# Patient Record
Sex: Male | Born: 2000 | Hispanic: Yes | Marital: Single | Smoking: Never smoker
Health system: Southern US, Community
[De-identification: ages and names within clinical notes are randomized; demographics above are authoritative.]

## PROBLEM LIST (undated history)

## (undated) DIAGNOSIS — K219 Gastro-esophageal reflux disease without esophagitis: Secondary | ICD-10-CM

## (undated) DIAGNOSIS — F410 Panic disorder [episodic paroxysmal anxiety] without agoraphobia: Secondary | ICD-10-CM

## (undated) DIAGNOSIS — J45909 Unspecified asthma, uncomplicated: Secondary | ICD-10-CM

## (undated) HISTORY — PX: HAND TENDON SURGERY: SHX663

---
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
## 2020-01-21 ENCOUNTER — Other Ambulatory Visit: Payer: Self-pay

## 2020-01-21 ENCOUNTER — Ambulatory Visit (HOSPITAL_COMMUNITY)
Admission: EM | Admit: 2020-01-21 | Discharge: 2020-01-21 | Disposition: A | Discharge status: Home / Self Care | Payer: No Typology Code available for payment source | Attending: Emergency Medicine | Admitting: Emergency Medicine

## 2020-01-21 ENCOUNTER — Encounter (HOSPITAL_COMMUNITY): Payer: Self-pay | Admitting: *Deleted

## 2020-01-21 DIAGNOSIS — Z79899 Other long term (current) drug therapy: Secondary | ICD-10-CM | POA: Diagnosis not present

## 2020-01-21 DIAGNOSIS — J029 Acute pharyngitis, unspecified: Secondary | ICD-10-CM | POA: Diagnosis present

## 2020-01-21 DIAGNOSIS — R05 Cough: Secondary | ICD-10-CM | POA: Diagnosis not present

## 2020-01-21 DIAGNOSIS — J45909 Unspecified asthma, uncomplicated: Secondary | ICD-10-CM | POA: Insufficient documentation

## 2020-01-21 DIAGNOSIS — J069 Acute upper respiratory infection, unspecified: Secondary | ICD-10-CM | POA: Diagnosis not present

## 2020-01-21 DIAGNOSIS — Z20822 Contact with and (suspected) exposure to covid-19: Secondary | ICD-10-CM | POA: Insufficient documentation

## 2020-01-21 HISTORY — DX: Unspecified asthma, uncomplicated: J45.909

## 2020-01-21 LAB — SARS CORONAVIRUS 2 (TAT 6-24 HRS): SARS Coronavirus 2: NEGATIVE

## 2020-01-21 MED ORDER — FLUTICASONE PROPIONATE 50 MCG/ACT NA SUSP: 1.0000 | Freq: Every day | NASAL | 0 refills | Status: AC

## 2020-01-21 MED ORDER — CETIRIZINE-PSEUDOEPHEDRINE ER 5-120 MG PO TB12: 1.0000 | ORAL_TABLET | Freq: Two times a day (BID) | ORAL | 0 refills | Status: AC

## 2020-01-21 NOTE — ED Triage Notes (Signed)
Pt reports an insect flying into his nose approx 4 days ago; states since then has had significant nasal congestion, runny nose and sore throat.  Denies fevers.

## 2020-01-21 NOTE — Discharge Instructions (Signed)
Covid test pending, monitor my chart for results Begin daily Zyrtec-D twice daily for congestion Flonase nasal spray 1 to 2 spray in each nostril daily Rest and fluids  Please return if any symptoms not improving or worsening

## 2020-01-22 NOTE — ED Provider Notes (Signed)
[facility]    CSN: [id number] Arrival date & time: 01/21/20  1657      History   Chief Complaint Chief Complaint  Patient presents with  . Nasal Congestion  . Sore Throat    HPI Bruce Maddox is a 19 y.o. male history of asthma, presenting today for evaluation of URI symptoms.  Patient has had nasal congestion, rhinorrhea and sore throat.  Symptoms have been going on for approximately 4 days.  Reports occasional cough, denies fevers.  Denies chest pain or shortness of breath.  Denies close sick contacts.  HPI  Past Medical History:  Diagnosis Date  . Asthma   . GERD (gastroesophageal reflux disease)   . Panic attack     There are no problems to display for this patient.   Past Surgical History:  Procedure Laterality Date  . HAND TENDON SURGERY         Home Medications    Prior to Admission medications   Medication Sig Start Date End Date Taking? Authorizing Provider  cetirizine-pseudoephedrine (ZYRTEC-D) 5-120 MG tablet Take 1 tablet by mouth 2 (two) times daily. 01/21/20   Williard Keller C, PA-C  fluticasone (FLONASE) 50 MCG/ACT nasal spray Place 1-2 sprays into both nostrils daily for 7 days. 01/21/20 01/28/20  Sunya Humbarger C, PA-C  naproxen (NAPROSYN) 250 MG tablet Take 1 tablet (250 mg total) by mouth 2 (two) times daily with a meal. 05/01/18   Baity, Salvadore Oxford, NP    Family History Family History  Problem Relation Age of Onset  . Healthy Mother   . Healthy Father     Social History Social History   Tobacco Use  . Smoking status: Never Smoker  . Smokeless tobacco: Never Used  Vaping Use  . Vaping Use: Never used  Substance Use Topics  . Alcohol use: Yes    Comment: occasionally  . Drug use: Not Currently    Types: Marijuana     Allergies   Patient has no known allergies.   Review of Systems Review of Systems  Constitutional: Negative for activity change, appetite change, chills, fatigue and fever.  HENT: Positive  for congestion, rhinorrhea and sore throat. Negative for ear pain, sinus pressure and trouble swallowing.   Eyes: Negative for discharge and redness.  Respiratory: Negative for cough, chest tightness and shortness of breath.   Cardiovascular: Negative for chest pain.  Gastrointestinal: Negative for abdominal pain, diarrhea, nausea and vomiting.  Musculoskeletal: Negative for myalgias.  Skin: Negative for rash.  Neurological: Negative for dizziness, light-headedness and headaches.     Physical Exam Triage Vital Signs ED Triage Vitals [01/21/20 1757]  Enc Vitals Group     BP 129/83     Pulse Rate 91     Resp 16     Temp 98.5 F (36.9 C)     Temp Source Oral     SpO2 98 %     Weight      Height      Head Circumference      Peak Flow      Pain Score 0     Pain Loc      Pain Edu?      Excl. in GC?    No data found.  Updated Vital Signs BP 129/83   Pulse 91   Temp 98.5 F (36.9 C) (Oral)   Resp 16   SpO2 98%   Visual Acuity Right Eye Distance:   Left Eye Distance:   Bilateral Distance:  Right Eye Near:   Left Eye Near:    Bilateral Near:     Physical Exam Vitals and nursing note reviewed.  Constitutional:      Appearance: He is well-developed.     Comments: No acute distress  HENT:     Head: Normocephalic and atraumatic.     Ears:     Comments: Bilateral ears without tenderness to palpation of external auricle, tragus and mastoid, EAC's without erythema or swelling, TM's with good bony landmarks and cone of light. Non erythematous.     Nose: Nose normal.     Mouth/Throat:     Comments: Oral mucosa pink and moist, no tonsillar enlargement or exudate. Posterior pharynx patent and nonerythematous, no uvula deviation or swelling. Normal phonation. Eyes:     Conjunctiva/sclera: Conjunctivae normal.  Cardiovascular:     Rate and Rhythm: Normal rate.  Pulmonary:     Effort: Pulmonary effort is normal. No respiratory distress.     Comments: Breathing  comfortably at rest, CTABL, no wheezing, rales or other adventitious sounds auscultated Abdominal:     General: There is no distension.  Musculoskeletal:        General: Normal range of motion.     Cervical back: Neck supple.  Skin:    General: Skin is warm and dry.  Neurological:     Mental Status: He is alert and oriented to person, place, and time.      UC Treatments / Results  Labs (all labs ordered are listed, but only abnormal results are displayed) Labs Reviewed  SARS CORONAVIRUS 2 (TAT 6-24 HRS)    EKG   Radiology No results found.  Procedures Procedures (including critical care time)  Medications Ordered in UC Medications - No data to display  Initial Impression / Assessment and Plan / UC Course  I have reviewed the triage vital signs and the nursing notes.  Pertinent labs & imaging results that were available during my care of the patient were reviewed by me and considered in my medical decision making (see chart for details).     URI symptoms x4 days, exam unremarkable, vital signs stable.  Covid PCR pending.  Recommending symptomatic and supportive care with close monitoring.  Discussed strict return precautions. Patient verbalized understanding and is agreeable with plan.  Final Clinical Impressions(s) / UC Diagnoses   Final diagnoses:  Viral URI with cough     Discharge Instructions     Covid test pending, monitor my chart for results Begin daily Zyrtec-D twice daily for congestion Flonase nasal spray 1 to 2 spray in each nostril daily Rest and fluids  Please return if any symptoms not improving or worsening   ED Prescriptions    Medication Sig Dispense Auth. Provider   cetirizine-pseudoephedrine (ZYRTEC-D) 5-120 MG tablet Take 1 tablet by mouth 2 (two) times daily. 30 tablet Codee Bloodworth C, PA-C   fluticasone (FLONASE) 50 MCG/ACT nasal spray Place 1-2 sprays into both nostrils daily for 7 days. 1 g Mckenley Birenbaum, Sunflower C, PA-C     PDMP  not reviewed this encounter.   Janith Lima, PA-C 01/22/20 1105

## 2020-03-04 ENCOUNTER — Emergency Department
Admission: EM | Admit: 2020-03-04 | Discharge: 2020-03-05 | Payer: Medicaid Other | Attending: Emergency Medicine | Admitting: Emergency Medicine

## 2020-03-04 ENCOUNTER — Other Ambulatory Visit: Payer: Self-pay

## 2020-03-04 DIAGNOSIS — Z7951 Long term (current) use of inhaled steroids: Secondary | ICD-10-CM | POA: Diagnosis not present

## 2020-03-04 DIAGNOSIS — Y9289 Other specified places as the place of occurrence of the external cause: Secondary | ICD-10-CM | POA: Diagnosis not present

## 2020-03-04 DIAGNOSIS — Y999 Unspecified external cause status: Secondary | ICD-10-CM | POA: Insufficient documentation

## 2020-03-04 DIAGNOSIS — S61012A Laceration without foreign body of left thumb without damage to nail, initial encounter: Secondary | ICD-10-CM | POA: Insufficient documentation

## 2020-03-04 DIAGNOSIS — J45909 Unspecified asthma, uncomplicated: Secondary | ICD-10-CM | POA: Insufficient documentation

## 2020-03-04 DIAGNOSIS — S51011A Laceration without foreign body of right elbow, initial encounter: Secondary | ICD-10-CM | POA: Insufficient documentation

## 2020-03-04 DIAGNOSIS — S61412A Laceration without foreign body of left hand, initial encounter: Secondary | ICD-10-CM | POA: Diagnosis not present

## 2020-03-04 DIAGNOSIS — S61411A Laceration without foreign body of right hand, initial encounter: Secondary | ICD-10-CM | POA: Diagnosis not present

## 2020-03-04 DIAGNOSIS — W25XXXA Contact with sharp glass, initial encounter: Secondary | ICD-10-CM | POA: Insufficient documentation

## 2020-03-04 DIAGNOSIS — S61511A Laceration without foreign body of right wrist, initial encounter: Secondary | ICD-10-CM | POA: Diagnosis not present

## 2020-03-04 DIAGNOSIS — Y9339 Activity, other involving climbing, rappelling and jumping off: Secondary | ICD-10-CM | POA: Diagnosis not present

## 2020-03-04 DIAGNOSIS — S61419A Laceration without foreign body of unspecified hand, initial encounter: Secondary | ICD-10-CM

## 2020-03-04 DIAGNOSIS — S41112A Laceration without foreign body of left upper arm, initial encounter: Secondary | ICD-10-CM | POA: Diagnosis present

## 2020-03-04 MED ORDER — LIDOCAINE HCL (PF) 1 % IJ SOLN
INTRAMUSCULAR | Status: AC
  Filled 2020-03-04: qty 15

## 2020-03-04 MED ORDER — LIDOCAINE HCL (PF) 1 % IJ SOLN
INTRAMUSCULAR | Status: AC
  Filled 2020-03-04: qty 5

## 2020-03-04 MED ORDER — LIDOCAINE HCL (PF) 1 % IJ SOLN
INTRAMUSCULAR | Status: AC
  Administered 2020-03-05: 30 mL via INTRADERMAL
  Filled 2020-03-04: qty 10

## 2020-03-04 NOTE — ED Notes (Signed)
EDP and PA bedside.
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
## 2020-03-05 MED ORDER — LIDOCAINE HCL (PF) 1 % IJ SOLN: 30.0000 mL | Freq: Once | INTRAMUSCULAR | Status: AC

## 2020-03-05 MED ORDER — SODIUM CHLORIDE 0.9 % IV BOLUS
1000.0000 mL | Freq: Once | INTRAVENOUS | Status: AC
  Administered 2020-03-05: 1000 mL via INTRAVENOUS

## 2020-03-05 MED ORDER — CEPHALEXIN 500 MG PO CAPS: 500.0000 mg | ORAL_CAPSULE | Freq: Two times a day (BID) | ORAL | 0 refills | Status: AC

## 2020-03-05 NOTE — ED Provider Notes (Signed)
[facility] Emergency Department Provider Note  ____________________________________________   First MD Initiated Contact with Patient 03/05/20 0014     (approximate)  I have reviewed the triage vital signs and the nursing notes.   HISTORY  Chief Complaint Laceration    HPI Bruce Maddox is a 19 y.o. male with below list of previous medical conditions presents to the emergency department in police custody secondary to multiple lacerations on the bilateral upper extremity.  Per the police officer the patient threw a brick through a window and attempted to climb through the window where the lacerations were sustained.       Past Medical History:  Diagnosis Date  . Asthma   . GERD (gastroesophageal reflux disease)   . Panic attack     There are no problems to display for this patient.   Past Surgical History:  Procedure Laterality Date  . HAND TENDON SURGERY      Prior to Admission medications   Medication Sig Start Date End Date Taking? Authorizing Provider  cetirizine-pseudoephedrine (ZYRTEC-D) 5-120 MG tablet Take 1 tablet by mouth 2 (two) times daily. 01/21/20   Wieters, Hallie C, PA-C  fluticasone (FLONASE) 50 MCG/ACT nasal spray Place 1-2 sprays into both nostrils daily for 7 days. 01/21/20 01/28/20  Wieters, Hallie C, PA-C  naproxen (NAPROSYN) 250 MG tablet Take 1 tablet (250 mg total) by mouth 2 (two) times daily with a meal. 05/01/18   Baity, Salvadore Oxford, NP    Allergies Patient has no known allergies.  Family History  Problem Relation Age of Onset  . Healthy Mother   . Healthy Father     Social History Social History   Tobacco Use  . Smoking status: Never Smoker  . Smokeless tobacco: Never Used  Vaping Use  . Vaping Use: Never used  Substance Use Topics  . Alcohol use: Yes    Comment: occasionally  . Drug use: Not Currently    Types: Marijuana    Review of Systems Constitutional: No fever/chills Eyes: No visual  changes. ENT: No sore throat. Cardiovascular: Denies chest pain. Respiratory: Denies shortness of breath. Gastrointestinal: No abdominal pain.  No nausea, no vomiting.  No diarrhea.  No constipation. Genitourinary: Negative for dysuria. Musculoskeletal: Negative for neck pain.  Negative for back pain. Integumentary: Negative for rash. Neurological: Negative for headaches, focal weakness or numbness. Psychiatric:  Patient denies any suicidal or homicidal ideation ____________________________________________   PHYSICAL EXAM:  VITAL SIGNS: ED Triage Vitals  Enc Vitals Group     BP --      Pulse --      Resp --      Temp --      Temp src --      SpO2 03/04/20 2325 96 %     Weight 03/04/20 2336 81.6 kg (180 lb)     Height 03/04/20 2336 1.778 m (5\' 10" )     Head Circumference --      Peak Flow --      Pain Score 03/04/20 2336 0     Pain Loc --      Pain Edu? --      Excl. in GC? --     Constitutional: Alert and oriented.  Eyes: Conjunctivae are normal.  Head: Atraumatic. Mouth/Throat: Patient is wearing a mask. Neck: No stridor.  No meningeal signs.   Cardiovascular: Normal rate, regular rhythm. Good peripheral circulation. Grossly normal heart sounds. Respiratory: Normal respiratory effort.  No retractions. Gastrointestinal: Soft and  nontender. No distention.  Musculoskeletal: No lower extremity tenderness nor edema. No gross deformities of extremities. Neurologic:  Normal speech and language. No gross focal neurologic deficits are appreciated.  Skin: 11 cm linear laceration right elbow.  4 cm V-shaped linear laceration right wrist on the ulnar aspect.  Please refer to procedure notes below regarding location and length of all lacerations. Psychiatric: Mood and affect are normal. Speech and behavior are normal.   ___________________________   PROCEDURES   Procedure(s) performed (including Critical Care):  Marland KitchenMarland KitchenLaceration Repair  Date/Time: 03/05/2020 2:22  AM Performed by: Darci Current, MD Authorized by: Darci Current, MD   Consent:    Consent obtained:  Verbal   Consent given by:  Patient   Risks discussed:  Infection, pain, retained foreign body, poor cosmetic result and poor wound healing Anesthesia (see MAR for exact dosages):    Anesthesia method:  Local infiltration   Local anesthetic:  Lidocaine 1% w/o epi Laceration details:    Location:  Shoulder/arm   Shoulder/arm location:  R elbow   Length (cm):  11 Repair type:    Repair type:  Simple Exploration:    Hemostasis achieved with:  Direct pressure   Wound exploration: entire depth of wound probed and visualized     Contaminated: no   Treatment:    Area cleansed with:  Saline and Betadine   Amount of cleaning:  Extensive   Irrigation solution:  Sterile saline   Visualized foreign bodies/material removed: no   Skin repair:    Repair method:  Sutures   Suture size:  4-0   Suture material:  Nylon   Suture technique:  Simple interrupted   Number of sutures:  11 Approximation:    Approximation:  Close Post-procedure details:    Dressing:  Sterile dressing   Patient tolerance of procedure:  Tolerated well, no immediate complications .Marland KitchenLaceration Repair  Date/Time: 03/05/2020 2:22 AM Performed by: Darci Current, MD Authorized by: Darci Current, MD   Consent:    Consent obtained:  Verbal   Consent given by:  Patient   Risks discussed:  Infection, pain, retained foreign body, poor cosmetic result and poor wound healing Anesthesia (see MAR for exact dosages):    Anesthesia method:  Local infiltration   Local anesthetic:  Lidocaine 1% w/o epi Laceration details:    Location:  Hand   Hand location:  L palm   Length (cm):  2 Repair type:    Repair type:  Simple Exploration:    Hemostasis achieved with:  Direct pressure   Wound exploration: entire depth of wound probed and visualized     Contaminated: no   Treatment:    Area cleansed with:  Saline    Amount of cleaning:  Extensive   Irrigation solution:  Sterile saline   Visualized foreign bodies/material removed: no   Skin repair:    Repair method:  Sutures   Suture size:  4-0   Suture material:  Nylon   Suture technique:  Simple interrupted   Number of sutures:  3 Approximation:    Approximation:  Close Post-procedure details:    Dressing:  Sterile dressing   Patient tolerance of procedure:  Tolerated well, no immediate complications .Marland KitchenLaceration Repair  Date/Time: 03/05/2020 2:23 AM Performed by: Darci Current, MD Authorized by: Darci Current, MD   Consent:    Consent obtained:  Verbal   Consent given by:  Patient   Risks discussed:  Infection, pain, retained foreign body, poor cosmetic  result and poor wound healing Anesthesia (see MAR for exact dosages):    Anesthesia method:  Local infiltration   Local anesthetic:  Lidocaine 1% w/o epi Laceration details:    Location:  Hand   Hand location:  L palm   Length (cm):  4 Repair type:    Repair type:  Simple Exploration:    Hemostasis achieved with:  Direct pressure   Wound exploration: entire depth of wound probed and visualized     Contaminated: no   Treatment:    Area cleansed with:  Saline   Amount of cleaning:  Extensive   Irrigation solution:  Sterile saline   Visualized foreign bodies/material removed: no   Skin repair:    Repair method:  Sutures   Suture size:  4-0   Suture material:  Nylon   Number of sutures:  11 Approximation:    Approximation:  Close Post-procedure details:    Dressing:  Sterile dressing   Patient tolerance of procedure:  Tolerated well, no immediate complications .Marland KitchenLaceration Repair  Date/Time: 03/05/2020 2:24 AM Performed by: Darci Current, MD Authorized by: Darci Current, MD   Consent:    Consent obtained:  Verbal   Consent given by:  Patient   Risks discussed:  Infection, pain, retained foreign body, poor cosmetic result and poor wound healing Anesthesia  (see MAR for exact dosages):    Anesthesia method:  Local infiltration   Local anesthetic:  Lidocaine 1% w/o epi Laceration details:    Location:  Finger   Finger location:  L thumb   Length (cm):  1 Repair type:    Repair type:  Simple Exploration:    Hemostasis achieved with:  Direct pressure   Wound exploration: entire depth of wound probed and visualized     Contaminated: no   Treatment:    Area cleansed with:  Saline   Amount of cleaning:  Extensive   Irrigation solution:  Sterile saline   Visualized foreign bodies/material removed: no   Skin repair:    Repair method:  Sutures   Suture size:  4-0   Suture material:  Nylon   Number of sutures:  3 Approximation:    Approximation:  Close Post-procedure details:    Dressing:  Sterile dressing   Patient tolerance of procedure:  Tolerated well, no immediate complications .Marland KitchenLaceration Repair  Date/Time: 03/05/2020 2:24 AM Performed by: Darci Current, MD Authorized by: Darci Current, MD   Consent:    Consent obtained:  Verbal   Consent given by:  Patient   Risks discussed:  Infection, pain, retained foreign body, poor cosmetic result and poor wound healing Anesthesia (see MAR for exact dosages):    Anesthesia method:  Local infiltration   Local anesthetic:  Lidocaine 1% w/o epi Laceration details:    Location:  Hand   Hand location:  R palm   Length (cm):  3 Repair type:    Repair type:  Simple Exploration:    Hemostasis achieved with:  Direct pressure   Wound exploration: entire depth of wound probed and visualized     Contaminated: no   Treatment:    Area cleansed with:  Saline   Amount of cleaning:  Extensive   Irrigation solution:  Sterile saline   Visualized foreign bodies/material removed: no   Skin repair:    Repair method:  Sutures   Suture size:  4-0   Suture material:  Nylon   Number of sutures:  6 Approximation:    Approximation:  Close Post-procedure details:    Dressing:  Sterile  dressing   Patient tolerance of procedure:  Tolerated well, no immediate complications .Marland Kitchen.Laceration Repair  Date/Time: 03/05/2020 2:25 AM Performed by: Darci CurrentBrown, [place] N, MD Authorized by: Darci CurrentBrown, [place] N, MD   Consent:    Consent obtained:  Verbal   Consent given by:  Patient   Risks discussed:  Infection, pain, retained foreign body, poor cosmetic result and poor wound healing Anesthesia (see MAR for exact dosages):    Anesthesia method:  Local infiltration   Local anesthetic:  Lidocaine 1% w/o epi Laceration details:    Location:  Finger   Finger location:  R thumb   Length (cm):  1 Repair type:    Repair type:  Simple Exploration:    Hemostasis achieved with:  Direct pressure   Wound exploration: entire depth of wound probed and visualized     Contaminated: no   Treatment:    Area cleansed with:  Saline   Amount of cleaning:  Extensive   Irrigation solution:  Sterile saline   Visualized foreign bodies/material removed: no   Skin repair:    Repair method:  Sutures   Suture size:  4-0   Suture material:  Nylon   Suture technique:  Simple interrupted   Number of sutures:  3 Approximation:    Approximation:  Close Post-procedure details:    Dressing:  Sterile dressing   Patient tolerance of procedure:  Tolerated well, no immediate complications .Marland Kitchen.Laceration Repair  Date/Time: 03/05/2020 2:25 AM Performed by: Darci CurrentBrown, [place] N, MD Authorized by: Darci CurrentBrown, [place] N, MD   Consent:    Consent obtained:  Verbal   Consent given by:  Patient   Risks discussed:  Infection, pain, retained foreign body, poor cosmetic result and poor wound healing Anesthesia (see MAR for exact dosages):    Anesthesia method:  Local infiltration   Local anesthetic:  Lidocaine 1% w/o epi Laceration details:    Location:  Hand   Hand location:  R wrist   Length (cm):  3 Repair type:    Repair type:  Simple Exploration:    Hemostasis achieved with:  Direct pressure   Wound exploration:  entire depth of wound probed and visualized     Contaminated: no   Treatment:    Area cleansed with:  Saline   Amount of cleaning:  Extensive   Irrigation solution:  Sterile saline   Visualized foreign bodies/material removed: no   Skin repair:    Repair method:  Sutures   Suture size:  4-0   Suture material:  Nylon   Suture technique:  Simple interrupted   Number of sutures:  3 Approximation:    Approximation:  Close Post-procedure details:    Dressing:  Sterile dressing   Patient tolerance of procedure:  Tolerated well, no immediate complications     ____________________________________________   INITIAL IMPRESSION / MDM / ASSESSMENT AND PLAN / ED COURSE  As part of my medical decision making, I reviewed the following data within the electronic MEDICAL RECORD NUMBER   19 year old male presented with above-stated history and physical exam with multiple lacerations were repaired as dictated above.  Patient given Keflex for logically.  Patient's tetanus vaccination should be up-to-date as the patient states that he received all vaccinations as directed.  Patient be discharged in police custody.  Patient is to have sutures removed in 7 days and instructed as such.  ____________________________________________  FINAL CLINICAL IMPRESSION(S) / ED DIAGNOSES  Final diagnoses:  Laceration of  hand without foreign body, unspecified laterality, initial encounter  Elbow laceration, right, initial encounter     MEDICATIONS GIVEN DURING THIS VISIT:  Medications  lidocaine (PF) (XYLOCAINE) 1 % injection (has no administration in time range)  lidocaine (PF) (XYLOCAINE) 1 % injection (has no administration in time range)  lidocaine (PF) (XYLOCAINE) 1 % injection (has no administration in time range)     ED Discharge Orders    None      *Please note:  Bruce Maddox was evaluated in Emergency Department on 03/05/2020 for the symptoms described in the history of present  illness. He was evaluated in the context of the global COVID-19 pandemic, which necessitated consideration that the patient might be at risk for infection with the SARS-CoV-2 virus that causes COVID-19. Institutional protocols and algorithms that pertain to the evaluation of patients at risk for COVID-19 are in a state of rapid change based on information released by regulatory bodies including the CDC and federal and state organizations. These policies and algorithms were followed during the patient's care in the ED.  Some ED evaluations and interventions may be delayed as a result of limited staffing during and after the pandemic.*  Note:  This document was prepared using Dragon voice recognition software and may include unintentional dictation errors.   Darci Current, MD 03/05/20 [phone]

## 2020-03-23 ENCOUNTER — Ambulatory Visit
Admission: RE | Admit: 2020-03-23 | Discharge: 2020-03-23 | Disposition: A | Discharge status: Home / Self Care | Payer: PRIVATE HEALTH INSURANCE | Source: Ambulatory Visit | Attending: Emergency Medicine | Admitting: Emergency Medicine

## 2020-03-23 ENCOUNTER — Other Ambulatory Visit: Payer: Self-pay

## 2020-03-23 VITALS — BP 109/77 | HR 88 | Temp 99.3°F | Resp 14

## 2020-03-23 DIAGNOSIS — J069 Acute upper respiratory infection, unspecified: Secondary | ICD-10-CM | POA: Diagnosis present

## 2020-03-23 LAB — POCT RAPID STREP A (OFFICE): Rapid Strep A Screen: NEGATIVE

## 2020-03-23 NOTE — ED Triage Notes (Signed)
Patient complains of sore throat, runny nose, and diarrhea. Today is day 3 of symptoms. Reports his girlfriend is also sick and has been tested for covid and is waiting on her results.

## 2020-03-23 NOTE — Discharge Instructions (Signed)
Your COVID test is pending.  You should self quarantine until the test result is back.    Take Tylenol as needed for fever or discomfort.  Rest and keep yourself hydrated.    Go to the emergency department if you develop acute worsening symptoms.     

## 2020-03-23 NOTE — ED Provider Notes (Signed)
Renaldo Fiddler    CSN: [id number] Arrival date & time: 03/23/20  1108      History   Chief Complaint Chief Complaint  Patient presents with  . Sore Throat  . Fever  . Diarrhea  . Nasal Congestion    HPI Bruce Maddox is a 19 y.o. male.   Patient presents with sore throat, runny nose, postnasal drip, diarrhea, headache x3 days.  His girlfriend has similar symptoms.  He denies fever, chills, rash, ear pain, shortness of breath, abdominal pain, vomiting, or other symptoms.  No treatments attempted at home.  The history is provided by the patient.    Past Medical History:  Diagnosis Date  . Asthma   . GERD (gastroesophageal reflux disease)   . Panic attack     There are no problems to display for this patient.   Past Surgical History:  Procedure Laterality Date  . HAND TENDON SURGERY         Home Medications    Prior to Admission medications   Medication Sig Start Date End Date Taking? Authorizing Provider  cetirizine-pseudoephedrine (ZYRTEC-D) 5-120 MG tablet Take 1 tablet by mouth 2 (two) times daily. 01/21/20   Wieters, Hallie C, PA-C  fluticasone (FLONASE) 50 MCG/ACT nasal spray Place 1-2 sprays into both nostrils daily for 7 days. 01/21/20 01/28/20  Wieters, Hallie C, PA-C  naproxen (NAPROSYN) 250 MG tablet Take 1 tablet (250 mg total) by mouth 2 (two) times daily with a meal. 05/01/18   Baity, Salvadore Oxford, NP    Family History Family History  Problem Relation Age of Onset  . Healthy Mother   . Healthy Father     Social History Social History   Tobacco Use  . Smoking status: Never Smoker  . Smokeless tobacco: Never Used  Vaping Use  . Vaping Use: Never used  Substance Use Topics  . Alcohol use: Yes    Comment: occasionally  . Drug use: Not Currently    Types: Marijuana     Allergies   Patient has no known allergies.   Review of Systems Review of Systems  Constitutional: Negative for chills and fever.  HENT: Positive for  postnasal drip, rhinorrhea and sore throat. Negative for ear pain.   Eyes: Negative for pain and visual disturbance.  Respiratory: Negative for cough and shortness of breath.   Cardiovascular: Negative for chest pain and palpitations.  Gastrointestinal: Positive for diarrhea. Negative for abdominal pain and vomiting.  Genitourinary: Negative for dysuria and hematuria.  Musculoskeletal: Negative for arthralgias and back pain.  Skin: Negative for color change and rash.  Neurological: Positive for headaches. Negative for dizziness, seizures, syncope, weakness and numbness.  All other systems reviewed and are negative.    Physical Exam Triage Vital Signs ED Triage Vitals  Enc Vitals Group     BP 03/23/20 1111 109/77     Pulse Rate 03/23/20 1111 88     Resp 03/23/20 1111 14     Temp 03/23/20 1111 99.3 F (37.4 C)     Temp src --      SpO2 03/23/20 1111 98 %     Weight --      Height --      Head Circumference --      Peak Flow --      Pain Score 03/23/20 1109 0     Pain Loc --      Pain Edu? --      Excl. in GC? --  No data found.  Updated Vital Signs BP 109/77   Pulse 88   Temp 99.3 F (37.4 C)   Resp 14   SpO2 98%   Visual Acuity Right Eye Distance:   Left Eye Distance:   Bilateral Distance:    Right Eye Near:   Left Eye Near:    Bilateral Near:     Physical Exam Vitals and nursing note reviewed.  Constitutional:      General: He is not in acute distress.    Appearance: He is well-developed. He is not ill-appearing.  HENT:     Head: Normocephalic and atraumatic.     Right Ear: Tympanic membrane normal.     Left Ear: Tympanic membrane normal.     Nose: Rhinorrhea present.     Mouth/Throat:     Mouth: Mucous membranes are moist.     Pharynx: Posterior oropharyngeal erythema present. No oropharyngeal exudate.  Eyes:     Conjunctiva/sclera: Conjunctivae normal.  Cardiovascular:     Rate and Rhythm: Normal rate and regular rhythm.     Heart sounds: No  murmur heard.   Pulmonary:     Effort: Pulmonary effort is normal. No respiratory distress.     Breath sounds: Normal breath sounds.  Abdominal:     Palpations: Abdomen is soft.     Tenderness: There is no abdominal tenderness. There is no guarding or rebound.  Musculoskeletal:     Cervical back: Neck supple.  Skin:    General: Skin is warm and dry.     Findings: No rash.  Neurological:     General: No focal deficit present.     Mental Status: He is alert and oriented to person, place, and time.     Gait: Gait normal.  Psychiatric:        Mood and Affect: Mood normal.        Behavior: Behavior normal.      UC Treatments / Results  Labs (all labs ordered are listed, but only abnormal results are displayed) Labs Reviewed  NOVEL CORONAVIRUS, NAA  CULTURE, GROUP A STREP [facility])  POCT RAPID STREP A (OFFICE)    EKG   Radiology No results found.  Procedures Procedures (including critical care time)  Medications Ordered in UC Medications - No data to display  Initial Impression / Assessment and Plan / UC Course  I have reviewed the triage vital signs and the nursing notes.  Pertinent labs & imaging results that were available during my care of the patient were reviewed by me and considered in my medical decision making (see chart for details).   Viral upper respiratory infection.  Rapid strep negative; throat culture pending.  PCR COVID pending.  Instructed patient to self quarantine until his test result is back.  Discussed symptomatic treatment, including Tylenol, rest, hydration.  Instructed him to go to the ED if he has acute worsening symptoms.  Patient agrees to plan of care.     Final Clinical Impressions(s) / UC Diagnoses   Final diagnoses:  Viral upper respiratory tract infection     Discharge Instructions     Your COVID test is pending.  You should self quarantine until the test result is back.    Take Tylenol as needed for fever or discomfort.  Rest  and keep yourself hydrated.    Go to the emergency department if you develop acute worsening symptoms.        ED Prescriptions    None     PDMP  not reviewed this encounter.   Mickie Bail, NP 03/23/20 1135

## 2020-03-25 LAB — CULTURE, GROUP A STREP (THRC)

## 2020-03-25 LAB — SARS-COV-2, NAA 2 DAY TAT

## 2020-03-25 LAB — NOVEL CORONAVIRUS, NAA: SARS-CoV-2, NAA: NOT DETECTED

## 2022-07-05 ENCOUNTER — Emergency Department: Payer: 59

## 2022-07-05 ENCOUNTER — Other Ambulatory Visit: Payer: Self-pay

## 2022-07-05 ENCOUNTER — Emergency Department
Admission: EM | Admit: 2022-07-05 | Discharge: 2022-07-05 | Disposition: A | Discharge status: Home / Self Care | Payer: 59 | Attending: Emergency Medicine | Admitting: Emergency Medicine

## 2022-07-05 DIAGNOSIS — I10 Essential (primary) hypertension: Secondary | ICD-10-CM | POA: Diagnosis not present

## 2022-07-05 DIAGNOSIS — M79602 Pain in left arm: Secondary | ICD-10-CM | POA: Insufficient documentation

## 2022-07-05 DIAGNOSIS — R519 Headache, unspecified: Secondary | ICD-10-CM | POA: Diagnosis present

## 2022-07-05 LAB — CBC WITH DIFFERENTIAL/PLATELET
Abs Immature Granulocytes: 0.03 10*3/uL (ref 0.00–0.07)
Basophils Absolute: 0 10*3/uL (ref 0.0–0.1)
Basophils Relative: 1 %
Eosinophils Absolute: 0 10*3/uL (ref 0.0–0.5)
Eosinophils Relative: 1 %
HCT: 46.6 % (ref 39.0–52.0)
Hemoglobin: 15.9 g/dL (ref 13.0–17.0)
Immature Granulocytes: 0 %
Lymphocytes Relative: 30 %
Lymphs Abs: 2.1 10*3/uL (ref 0.7–4.0)
MCH: 29 pg (ref 26.0–34.0)
MCHC: 34.1 g/dL (ref 30.0–36.0)
MCV: 84.9 fL (ref 80.0–100.0)
Monocytes Absolute: 0.5 10*3/uL (ref 0.1–1.0)
Monocytes Relative: 6 %
Neutro Abs: 4.3 10*3/uL (ref 1.7–7.7)
Neutrophils Relative %: 62 %
Platelets: 223 10*3/uL (ref 150–400)
RBC: 5.49 MIL/uL (ref 4.22–5.81)
RDW: 12.3 % (ref 11.5–15.5)
WBC: 7 10*3/uL (ref 4.0–10.5)
nRBC: 0 % (ref 0.0–0.2)

## 2022-07-05 LAB — COMPREHENSIVE METABOLIC PANEL
ALT: 79 U/L — ABNORMAL HIGH (ref 0–44)
AST: 49 U/L — ABNORMAL HIGH (ref 15–41)
Albumin: 4.7 g/dL (ref 3.5–5.0)
Alkaline Phosphatase: 80 U/L (ref 38–126)
Anion gap: 10 (ref 5–15)
BUN: 11 mg/dL (ref 6–20)
CO2: 27 mmol/L (ref 22–32)
Calcium: 9.5 mg/dL (ref 8.9–10.3)
Chloride: 103 mmol/L (ref 98–111)
Creatinine, Ser: 0.64 mg/dL (ref 0.61–1.24)
GFR, Estimated: >60 mL/min (ref >60)
Glucose, Bld: 91 mg/dL (ref 70–99)
Potassium: 3.7 mmol/L (ref 3.5–5.1)
Sodium: 140 mmol/L (ref 135–145)
Total Bilirubin: 0.8 mg/dL (ref 0.3–1.2)
Total Protein: 7.9 g/dL (ref 6.5–8.1)

## 2022-07-05 LAB — TROPONIN I (HIGH SENSITIVITY): Troponin I (High Sensitivity): 6 ng/L (ref <18)

## 2022-07-05 NOTE — ED Provider Triage Note (Signed)
Emergency Medicine Provider Triage Evaluation Note  Bruce Maddox , a 21 y.o. male  was evaluated in triage.  Pt complains of elevated blood pressure, left-sided chest pain with complaint of left arm pain.  Patient also complains of generalized headache for the last 4 days.  Review of Systems  Positive: Chest pain, left arm pain, headache. Negative: No shortness of breath, dizziness, difficulty breathing or previous cardiac history  Physical Exam  Wt 83.9 kg   BMI 26.54 kg/m  Gen:   Awake, no distress   Resp:  Normal effort  MSK:   Moves extremities without difficulty  Other:    Medical Decision Making  Medically screening exam initiated at 2:44 PM.  Appropriate orders placed.  Bruce Maddox was informed that the remainder of the evaluation will be completed by another provider, this initial triage assessment does not replace that evaluation, and the importance of remaining in the ED until their evaluation is complete.     Tommi Rumps, PA-C 07/05/22 1445

## 2022-07-05 NOTE — ED Triage Notes (Signed)
Pt also reports intermittent left arm pain, intermittent chest pain, and headache over the past 4 days.

## 2022-07-05 NOTE — ED Triage Notes (Signed)
Pt presents via POV c/o hypertension. Reports his BP was 160 systolic today and presents to ED. Reports told by PCP his BP is elevated.

## 2022-07-05 NOTE — Discharge Instructions (Signed)
Please reach out to your PCP for an appointment.  Please monitor your blood pressure over the next 2 weeks.

## 2022-07-05 NOTE — ED Provider Notes (Signed)
[facility] Provider Note  Patient Contact: 3:40 PM (approximate)   History   Hypertension   HPI  Bruce Maddox is a 21 y.o. male with an unremarkable past medical history, presents to the emergency department headache, intermittent right-sided chest pain and left arm pain for the past 4 days.  Patient also reports that his blood pressure has been elevated at home.  Patient states that he has intermittently had elevated blood pressure since his late teens.  He denies any known cardiac issues and denies being a daily smoker.  No family history of cardiac disease.  He denies current chest pain, chest tightness or shortness of breath.  No fever or chills.  No nausea, vomiting or abdominal pain.  No fever at home.  No recent illness.  Patient has been under increased stress lately as he anticipates becoming a father soon.      Physical Exam   Triage Vital Signs: ED Triage Vitals  Enc Vitals Group     BP 07/05/22 1454 (!) 152/71     Pulse Rate 07/05/22 1454 80     Resp 07/05/22 1454 18     Temp 07/05/22 1454 98.2 F (36.8 C)     Temp Source 07/05/22 1454 Oral     SpO2 07/05/22 1454 100 %     Weight 07/05/22 1433 185 lb (83.9 kg)     Height --      Head Circumference --      Peak Flow --      Pain Score 07/05/22 1433 0     Pain Loc --      Pain Edu? --      Excl. in GC? --     Most recent vital signs: Vitals:   07/05/22 1454  BP: (!) 152/71  Pulse: 80  Resp: 18  Temp: 98.2 F (36.8 C)  SpO2: 100%     General: Alert and in no acute distress. Eyes:  PERRL. EOMI. Head: No acute traumatic findings ENT:      Nose: No congestion/rhinnorhea.      Mouth/Throat: Mucous membranes are moist. Neck: No stridor. No cervical spine tenderness to palpation. Cardiovascular:  Good peripheral perfusion Respiratory: Normal respiratory effort without tachypnea or retractions. Lungs CTAB. Good air entry to the bases with no decreased or absent breath  sounds. Gastrointestinal: Bowel sounds 4 quadrants. Soft and nontender to palpation. No guarding or rigidity. No palpable masses. No distention. No CVA tenderness. Musculoskeletal: Full range of motion to all extremities.  Neurologic:  No gross focal neurologic deficits are appreciated.  Skin:   No rash noted    ED Results / Procedures / Treatments   Labs (all labs ordered are listed, but only abnormal results are displayed) Labs Reviewed  COMPREHENSIVE METABOLIC PANEL - Abnormal; Notable for the following components:      Result Value   AST 49 (*)    ALT 79 (*)    All other components within normal limits  CBC WITH DIFFERENTIAL/PLATELET  TROPONIN I (HIGH SENSITIVITY)     EKG  Early repolarization without ST segment elevation or other apparent arrhythmia.   RADIOLOGY  I personally viewed and evaluated these images as part of my medical decision making, as well as reviewing the written report by the radiologist.  ED Provider Interpretation: Chest x-ray unremarkable.   PROCEDURES:  Critical Care performed: No  Procedures   MEDICATIONS ORDERED IN ED: Medications - No data to display   IMPRESSION / MDM /  ASSESSMENT AND PLAN / ED COURSE  I reviewed the triage vital signs and the nursing notes.                              Assessment and plan Hypertension Headache Chest pain 21 year old male presents to the emergency department with complaints of elevated blood pressure at home as well as intermittent chest discomfort, left arm pain and headache.  Patient was mildly hypertensive at triage but vital signs were otherwise reassuring.  On exam, patient was alert, active and nontoxic-appearing.  EKG indicated normal sinus rhythm with possible early repolarization but no ST segment elevation or other apparent arrhythmia.  Troponin within range.  Patient had slight elevation of AST and ALT but CMP otherwise unremarkable.  CBC within range.  Chest x-ray  unremarkable.  I recommended patient keeping a blood pressure journal over the next 2 weeks and making a follow-up with his PCP.  Patient reports that he is going to phone his PCP on Monday.  We talked about healthy lifestyle changes such as a heart healthy diet and stress management.  Return precautions were given to return with new or worsening symptoms.  All patient questions were answered.     FINAL CLINICAL IMPRESSION(S) / ED DIAGNOSES   Final diagnoses:  Hypertension, unspecified type     Rx / DC Orders   ED Discharge Orders     None        Note:  This document was prepared using Dragon voice recognition software and may include unintentional dictation errors.   Pia Mau [place], PA-C 07/05/22 1619    Sharman Cheek, MD 07/05/22 Ernestina [place]

## 2022-12-02 ENCOUNTER — Ambulatory Visit: Payer: 59 | Admitting: Family Medicine

## 2023-06-05 ENCOUNTER — Other Ambulatory Visit: Payer: Self-pay

## 2023-06-05 ENCOUNTER — Emergency Department
Admission: EM | Admit: 2023-06-05 | Discharge: 2023-06-05 | Disposition: A | Discharge status: Home / Self Care | Payer: BC Managed Care – PPO | Attending: Emergency Medicine | Admitting: Emergency Medicine

## 2023-06-05 DIAGNOSIS — R42 Dizziness and giddiness: Secondary | ICD-10-CM | POA: Diagnosis present

## 2023-06-05 DIAGNOSIS — R410 Disorientation, unspecified: Secondary | ICD-10-CM | POA: Insufficient documentation

## 2023-06-05 MED ORDER — MECLIZINE HCL 25 MG PO TABS
25.0000 mg | ORAL_TABLET | Freq: Once | ORAL | Status: AC
  Administered 2023-06-05: 25 mg via ORAL
  Filled 2023-06-05: qty 1

## 2023-06-05 MED ORDER — MECLIZINE HCL 25 MG PO TABS: 25.0000 mg | ORAL_TABLET | Freq: Three times a day (TID) | ORAL | 0 refills | Status: AC | PRN

## 2023-06-05 NOTE — ED Provider Notes (Signed)
[place] EMERGENCY DEPARTMENT AT [facility] Provider Note   CSN: [id number] Arrival date & time: 06/05/23  1447     History  Chief Complaint  Patient presents with   Dizziness    Bruce Maddox is a 22 y.o. male.  Presents to the emergency department for evaluation of dizziness.  Earlier today patient developed dizziness with positional changes mostly with lying down or looking down.  Patient states when he lies flat or looks to the left he feels as if the room is spinning.  He is fine with being still.  He has had some nausea but no vomiting.  No headaches fevers chills or bodyaches.  No cough congestion runny nose.  He has had the symptoms in the past but typically they resolve off, states maybe once a year he will have some dizziness but it goes away on its own.  HPI     Home Medications Prior to Admission medications   Medication Sig Start Date End Date Taking? Authorizing Provider  meclizine (ANTIVERT) 25 MG tablet Take 1 tablet (25 mg total) by mouth 3 (three) times daily as needed for dizziness. 06/05/23  Yes Evon Slack, PA-C  cetirizine-pseudoephedrine (ZYRTEC-D) 5-120 MG tablet Take 1 tablet by mouth 2 (two) times daily. 01/21/20   Wieters, Hallie C, PA-C  fluticasone (FLONASE) 50 MCG/ACT nasal spray Place 1-2 sprays into both nostrils daily for 7 days. 01/21/20 01/28/20  Wieters, Hallie C, PA-C  naproxen (NAPROSYN) 250 MG tablet Take 1 tablet (250 mg total) by mouth 2 (two) times daily with a meal. 05/01/18   Baity, Salvadore [place], NP      Allergies    Patient has no known allergies.    Review of Systems   Review of Systems  Physical Exam Updated Vital Signs BP 128/64   Pulse 67   Temp 98.9 F (37.2 C) (Oral)   Resp 18   Ht 5\' 10"  (1.778 m)   Wt 89.8 kg   SpO2 96%   BMI 28.41 kg/m  Physical Exam Constitutional:      Appearance: He is well-developed.  HENT:     Head: Normocephalic and atraumatic.     Right Ear: Tympanic membrane, ear  canal and external ear normal.     Left Ear: Tympanic membrane, ear canal and external ear normal.     Nose: No congestion or rhinorrhea.     Mouth/Throat:     Mouth: Mucous membranes are moist.     Pharynx: No oropharyngeal exudate or posterior oropharyngeal erythema.  Eyes:     General:        Right eye: No discharge.        Left eye: No discharge.     Extraocular Movements: Extraocular movements intact.     Conjunctiva/sclera: Conjunctivae normal.     Pupils: Pupils are equal, round, and reactive to light.  Cardiovascular:     Rate and Rhythm: Normal rate.  Pulmonary:     Effort: Pulmonary effort is normal. No respiratory distress.  Musculoskeletal:        General: Normal range of motion.     Cervical back: Normal range of motion.  Skin:    General: Skin is warm.     Findings: No rash.  Neurological:     General: No focal deficit present.     Mental Status: He is alert. Mental status is at baseline. He is disoriented.     Cranial Nerves: No cranial nerve deficit.  Motor: No weakness.     Gait: Gait normal.     Comments: Positive nystagmus with rotation to the left with Dix-Hallpike.  Improvement with Epley maneuver  Psychiatric:        Mood and Affect: Mood normal.        Behavior: Behavior normal.        Thought Content: Thought content normal.        Judgment: Judgment normal.     ED Results / Procedures / Treatments   Labs (all labs ordered are listed, but only abnormal results are displayed) Labs Reviewed - No data to display  EKG None  Radiology No results found.  Procedures Procedures    Medications Ordered in ED Medications  meclizine (ANTIVERT) tablet 25 mg (25 mg Oral Given 06/05/23 1727)    ED Course/ Medical Decision Making/ A&P                                 Medical Decision Making  22 year old with vertigo.  His vertigo appears to be positional only.  He had positive Dix-Hallpike maneuver with nystagmus to the left.  This was  improved with Epley maneuver with meclizine.  He has no neurological deficits on exam.  No vision changes.  No headaches.  He appears well.  Patient will discharge home with meclizine p.o. and will be given strict return precautions to follow-up to the ER. Final Clinical Impression(s) / ED Diagnoses Final diagnoses:  Vertigo    Rx / DC Orders ED Discharge Orders          Ordered    meclizine (ANTIVERT) 25 MG tablet  3 times daily PRN        06/05/23 1755              Evon Slack, PA-C 06/05/23 1811    Shaune Pollack, MD 06/12/23 1136

## 2023-06-05 NOTE — Discharge Instructions (Signed)
Please take medications as prescribed as needed for dizziness.  Return to the ER for any worsening symptoms, dizziness, weakness, vomiting, fevers, vision changes, worsening symptoms or any urgent changes in your health.

## 2023-06-05 NOTE — ED Triage Notes (Signed)
Pt sts that he has vertigo since waking up with AM and has not been able to get the vertigo to go away.

## 2023-11-30 DIAGNOSIS — R0683 Snoring: Secondary | ICD-10-CM | POA: Insufficient documentation

## 2024-07-01 DIAGNOSIS — F419 Anxiety disorder, unspecified: Secondary | ICD-10-CM | POA: Insufficient documentation

## 2024-07-15 DIAGNOSIS — J342 Deviated nasal septum: Secondary | ICD-10-CM | POA: Insufficient documentation

## 2024-07-15 DIAGNOSIS — J4520 Mild intermittent asthma, uncomplicated: Secondary | ICD-10-CM | POA: Insufficient documentation

## 2024-07-15 DIAGNOSIS — R030 Elevated blood-pressure reading, without diagnosis of hypertension: Secondary | ICD-10-CM | POA: Insufficient documentation

## 2024-08-26 ENCOUNTER — Ambulatory Visit: Admitting: Family Medicine

## 2024-08-26 VITALS — BP 153/83 | HR 74 | Temp 97.9°F | Resp 16 | Wt 212.6 lb

## 2024-08-26 DIAGNOSIS — R030 Elevated blood-pressure reading, without diagnosis of hypertension: Secondary | ICD-10-CM | POA: Diagnosis not present

## 2024-08-26 DIAGNOSIS — J4540 Moderate persistent asthma, uncomplicated: Secondary | ICD-10-CM | POA: Diagnosis not present

## 2024-08-26 MED ORDER — SYMBICORT 160-4.5 MCG/ACT IN AERO: 2.0000 | INHALATION_SPRAY | Freq: Every day | RESPIRATORY_TRACT | 5 refills | Status: AC

## 2024-08-27 DIAGNOSIS — J4540 Moderate persistent asthma, uncomplicated: Secondary | ICD-10-CM | POA: Insufficient documentation

## 2024-08-27 NOTE — Assessment & Plan Note (Signed)
 Reports blood pressure readings with 150 systolic.  Mother does all the cooking and reports it is a low-salt diet.  Only exercises on Sunday when he plays soccer.  Please check your blood pressure after a 5 to 10-minute rest and record.  Please bring your recordings and your blood pressure cuff back to the office in 2 weeks so we can check for accuracy.

## 2024-08-27 NOTE — Progress Notes (Signed)
 "  Established Patient Office Visit  Subjective   Patient ID: Bruce Maddox, male    DOB: 2001/02/23  Age: 24 y.o. MRN: [id number]  Chief Complaint  Patient presents with   Establish Care    Pt. Here to establish care. Pt. Denies pain at this time.     HPI Discussed the use of AI scribe software for clinical note transcription with the patient, who gave verbal consent to proceed.  History of Present Illness   Bruce Maddox is a 24 year old male with hypertension who presents for blood pressure management.  He has a history of hypertension with home blood pressure readings typically ranging from 132 to 150 systolic. He monitors his blood pressure at home using a recently purchased automated cuff. His diet includes a lot of red meat, and his mother cooks with minimal salt. He acknowledges a lack of physical activity, stating he is only active on Sundays when he plays soccer. 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 Family history is significant for hypertension on both sides of his family.  He also has a history of asthma for which he uses albuterol and Symbicort  inhalers. He takes two puffs of Symbicort  once a day and ensures to rinse his mouth after use and brushes his teeth to prevent thrush. No nocturnal asthma symptoms or emergency room visits for wheezing. He denies needing to use his inhalers at night.      Bruce Maddox is a 24 year old male with hypertension who presents for blood pressure management.  He has a history of hypertension with home blood pressure readings typically ranging from 132 to 150 systolic. He monitors his blood pressure at home using a recently purchased automated cuff. His diet includes a lot of red meat, and his mother cooks with minimal salt. He acknowledges a lack of physical activity, stating he is only active on Sundays when he  plays soccer. 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 Family history is significant for hypertension on both sides of his family.  He also has a history of asthma for which he uses albuterol and Symbicort  inhalers. He takes two puffs of Symbicort  once a day and ensures to rinse his mouth after use to prevent thrush. No nocturnal asthma symptoms or emergency room visits for wheezing. He denies needing to use his inhalers at night.     Objective:     BP (!) 153/83 (Cuff Size: Normal)   Pulse 74   Temp 97.9 F (36.6 C) (Oral)   Resp 16   Wt 212 lb 9.6 oz (96.4 kg)   SpO2 98%   BMI 30.50 kg/m    Physical Exam Vitals and nursing note reviewed.  Constitutional:      Appearance: Normal appearance.  HENT:     Head: Normocephalic and atraumatic.  Eyes:     Conjunctiva/sclera: Conjunctivae normal.  Cardiovascular:     Rate and Rhythm: Normal rate and regular rhythm.  Pulmonary:     Effort: Pulmonary effort is normal.     Breath sounds: Normal breath sounds.  Musculoskeletal:     Right lower leg: No edema.     Left lower leg: No edema.  Skin:    General: Skin is warm and dry.  Neurological:     Mental Status: He is alert  and oriented to person, place, and time.  Psychiatric:        Mood and Affect: Mood normal.        Behavior: Behavior normal.        Thought Content: Thought content normal.        Judgment: Judgment normal.          No results found for any visits on 08/26/24.    The ASCVD Risk score (Arnett DK, et al., 2019) failed to calculate for the following reasons:   The 2019 ASCVD risk score is only valid for ages 42 to 80   * - Cholesterol units were assumed    Assessment & Plan:  Moderate persistent asthma without complication Assessment & Plan: Uses two puffs of Symbicort  daily and seldom needs albuterol.  His asthma is well controlled.    Orders: -      Symbicort ; Inhale 2 puffs into the lungs daily.  Dispense: 1 each; Refill: 5  Elevated blood-pressure reading without diagnosis of hypertension Assessment & Plan: Reports blood pressure readings with 150 systolic.  Mother does all the cooking and reports it is a low-salt diet.  Only exercises on Sunday when he plays soccer.  Please check your blood pressure after a 5 to 10-minute rest and record.  Please bring your recordings and your blood pressure cuff back to the office in 2 weeks so we can check for accuracy.      Return in about 2 weeks (around 09/09/2024).    Malacki Mcphearson K Denson Niccoli, MD  "

## 2024-08-27 NOTE — Assessment & Plan Note (Signed)
 Uses two puffs of Symbicort  daily and seldom needs albuterol.  His asthma is well controlled.

## 2024-08-31 ENCOUNTER — Telehealth: Payer: Self-pay | Admitting: Family Medicine

## 2024-08-31 NOTE — Telephone Encounter (Signed)
 Number is not In service attempted to call pt to r/s clinical appt from 09/05/2024 to 09/07/2024. However pts number is not in service.   PT's appt has been r/s to 09/07/2024 at 3:50pm.

## 2024-09-05 ENCOUNTER — Ambulatory Visit

## 2024-09-07 ENCOUNTER — Ambulatory Visit

## 2024-09-07 ENCOUNTER — Other Ambulatory Visit: Payer: Self-pay

## 2024-09-07 DIAGNOSIS — Z0000 Encounter for general adult medical examination without abnormal findings: Secondary | ICD-10-CM

## 2024-09-11 ENCOUNTER — Telehealth: Payer: Self-pay | Admitting: Family Medicine

## 2024-09-11 NOTE — Telephone Encounter (Signed)
 Attempted to call pt however it stated number on file is not in service.   Sent pt a MyChart message to call our office to r/s due to office being closed from inclement weather.

## 2024-09-12 ENCOUNTER — Ambulatory Visit: Admitting: Family Medicine
